# Patient Record
Sex: Male | Born: 2000 | Race: White | Hispanic: No | Marital: Single | Smoking: Never smoker
Health system: Southern US, Community
[De-identification: ages and names within clinical notes are randomized; demographics above are authoritative.]

## PROBLEM LIST (undated history)

## (undated) DIAGNOSIS — F909 Attention-deficit hyperactivity disorder, unspecified type: Secondary | ICD-10-CM

---
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
## 2019-03-26 IMAGING — CT CT MAXILLOFACIAL W/O CM
1 series · 16 of 30 positions shown, 20 images · non-contrast
Comparison: None.

CLINICAL DATA: Left-sided jaw pain following blunt trauma, initial
encounter

EXAM:
CT MAXILLOFACIAL WITHOUT CONTRAST
TECHNIQUE: Multidetector CT imaging of the maxillofacial structures was
performed. Multiplanar CT image reconstructions were also generated.

[Series 4: max soft · axial · 0.39mm/px · z∈[-194,-32]mm · 16 of 87 slices shown, 20 images]
[im 3/87  brain]
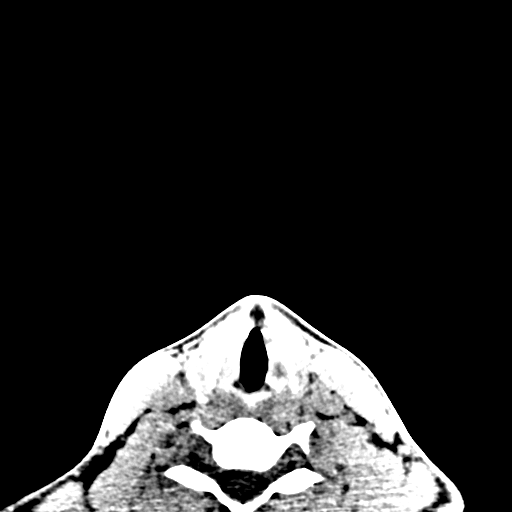
[im 3/87  bone]
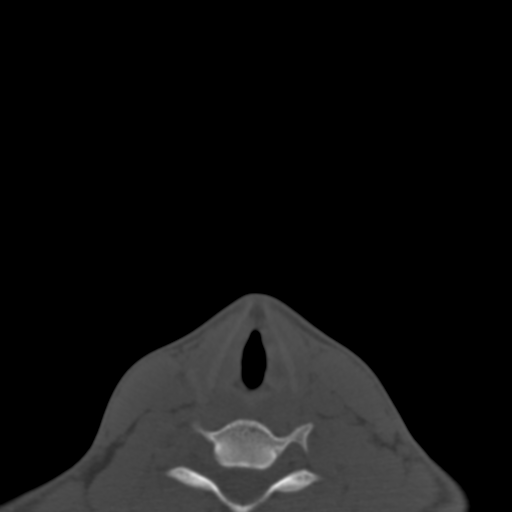
[im 9/87  bone]
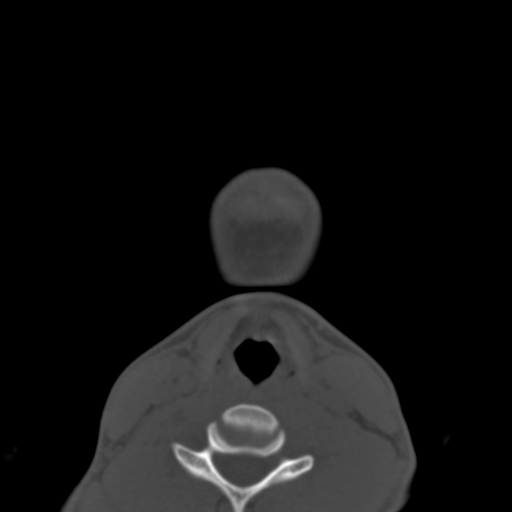
[im 15/87  bone]
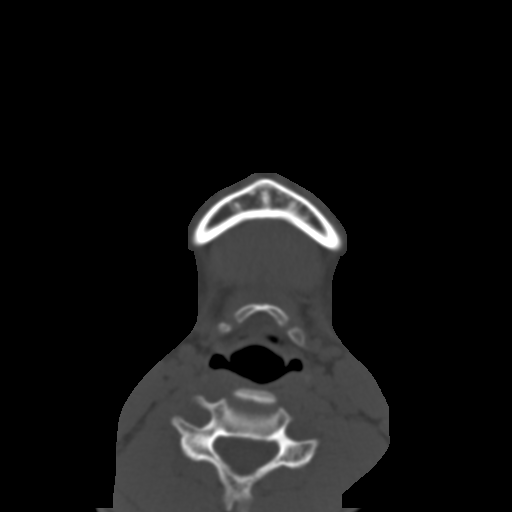
[im 21/87  bone]
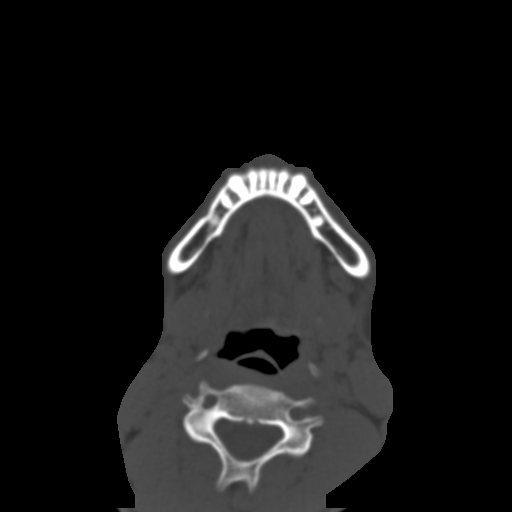
[im 24/87  brain]
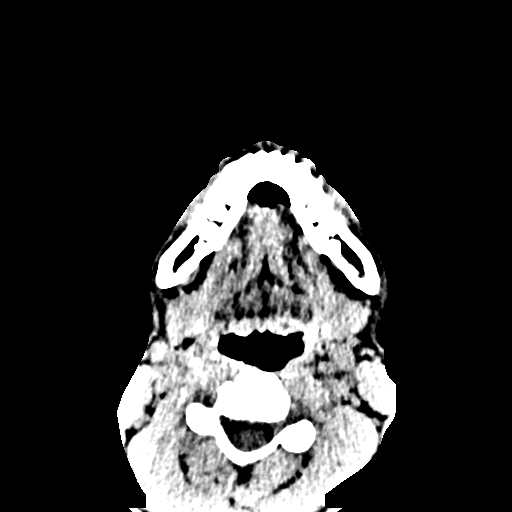
[im 24/87  bone]
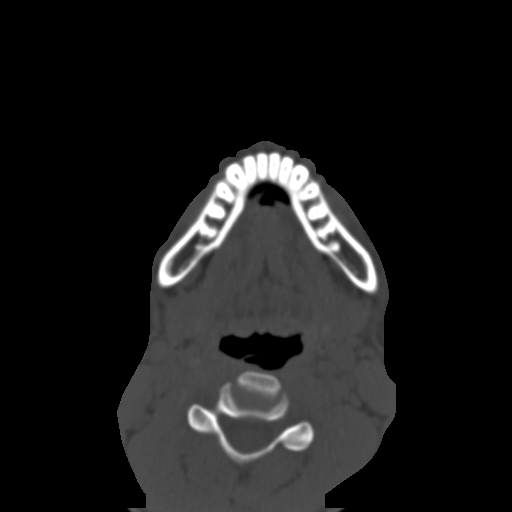
[im 30/87  bone]
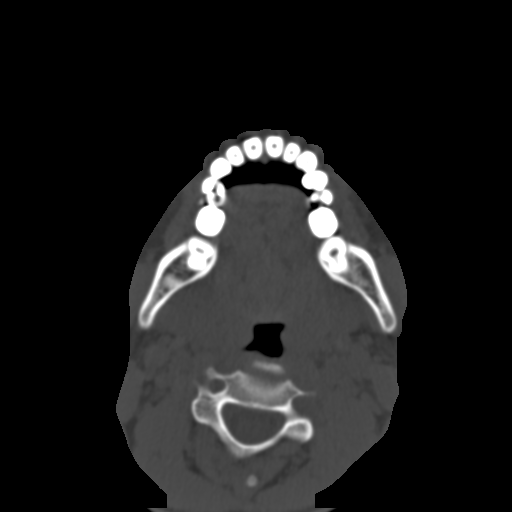
[im 36/87  bone]
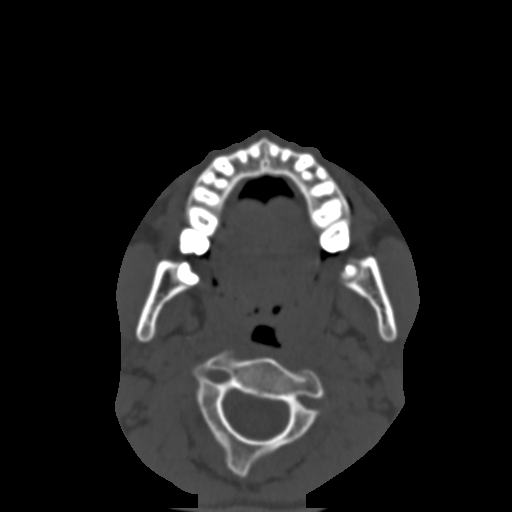
[im 42/87  bone]
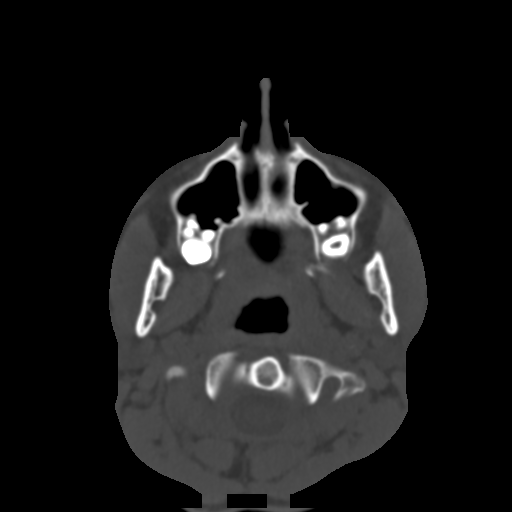
[im 45/87  brain]
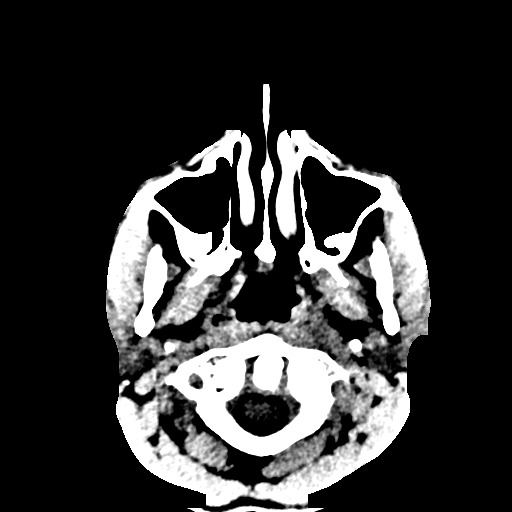
[im 45/87  bone]
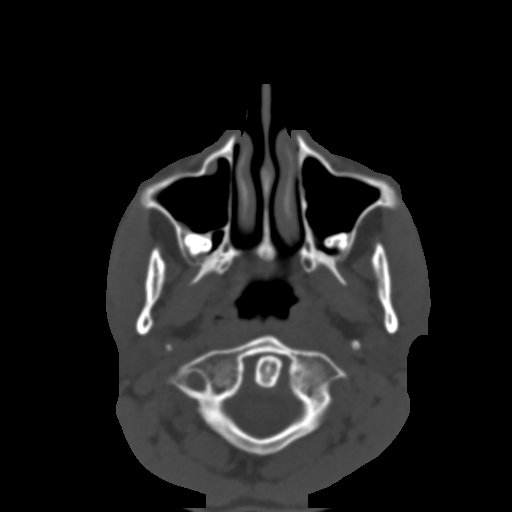
[im 51/87  bone]
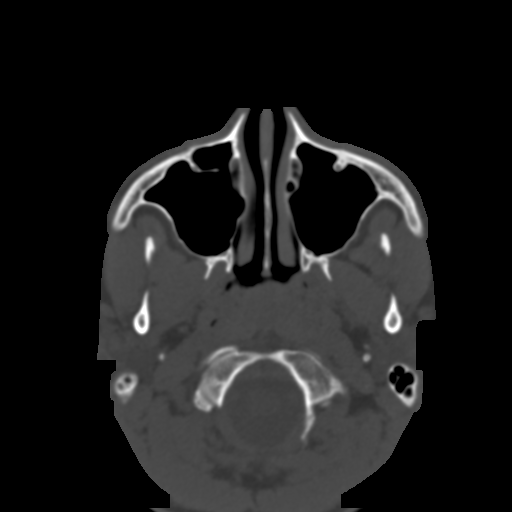
[im 57/87  bone]
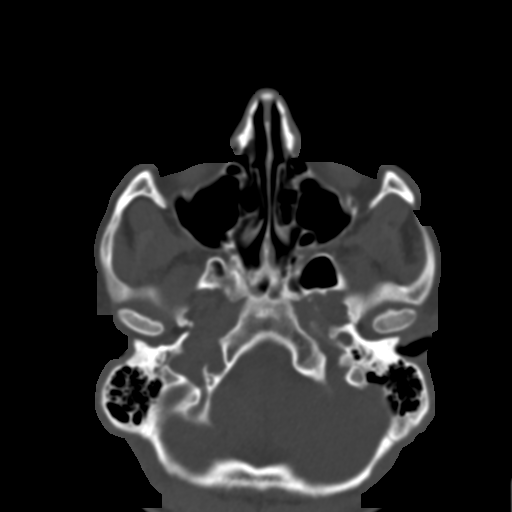
[im 63/87  bone]
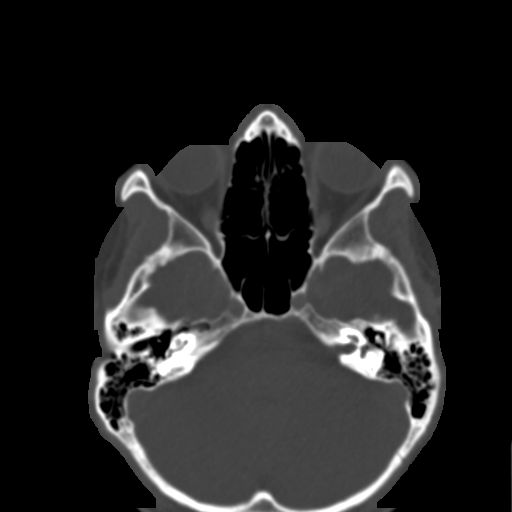
[im 66/87  brain]
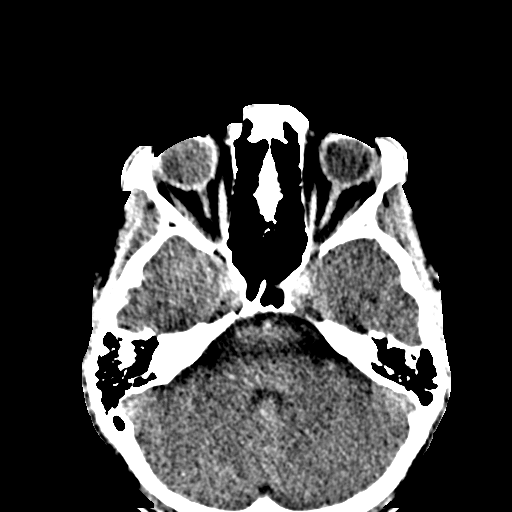
[im 66/87  bone]
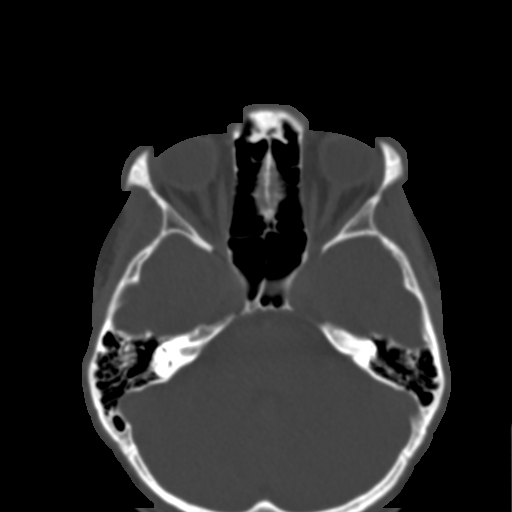
[im 72/87  bone]
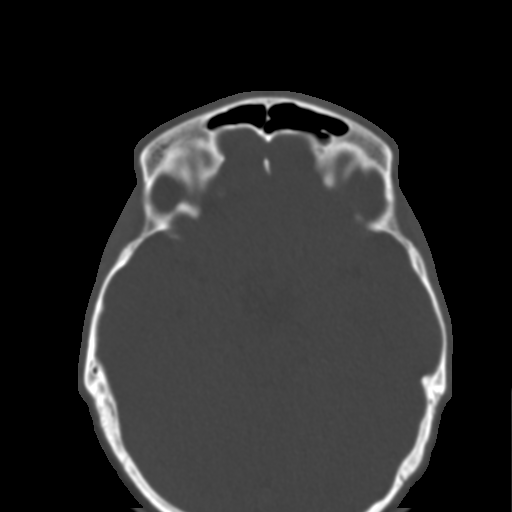
[im 78/87  bone]
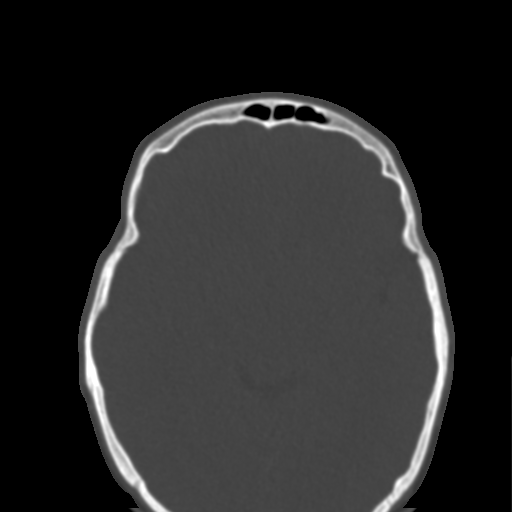
[im 84/87  bone]
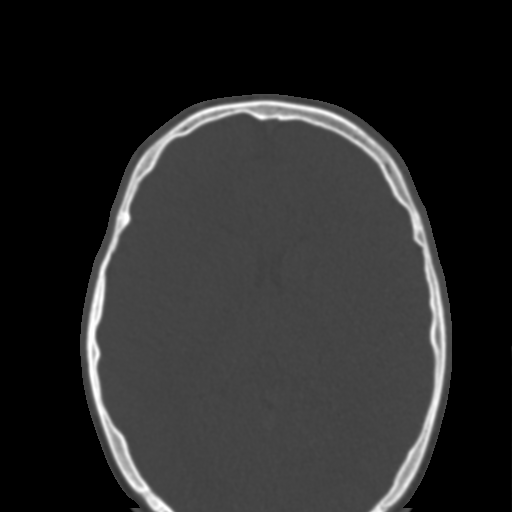

[16 of 30 positions shown; findings below may reference images not displayed]

FINDINGS: Osseous: No acute bony abnormality is noted.

Orbits: Orbits and their contents are within normal limits.

Sinuses: Paranasal sinuses are well aerated. The ostiomeatal complex
is patent bilaterally.

Soft tissues: Surrounding soft tissues demonstrate minimal edema
about the left jaw and mass or muscle consistent with the recent
injury. No focal hematoma is seen.

Limited intracranial: Within normal limits.
IMPRESSION: No acute bony abnormality noted. Very mild soft tissue swelling is
noted in the area of clinical concern.
